# Patient Record
Sex: Male | Born: 1945 | Race: White | Hispanic: No | Marital: Married
Health system: Southern US, Community
[De-identification: ages and names within clinical notes are randomized; demographics above are authoritative.]

## PROBLEM LIST (undated history)

## (undated) DIAGNOSIS — N200 Calculus of kidney: Secondary | ICD-10-CM

## (undated) HISTORY — DX: Calculus of kidney: N20.0

---

## 2002-05-25 DIAGNOSIS — C8590 Non-Hodgkin lymphoma, unspecified, unspecified site: Secondary | ICD-10-CM

## 2002-05-25 HISTORY — DX: Non-Hodgkin lymphoma, unspecified, unspecified site: C85.90

## 2004-04-04 ENCOUNTER — Ambulatory Visit: Payer: Self-pay | Admitting: Oncology

## 2004-04-24 ENCOUNTER — Ambulatory Visit: Payer: Self-pay | Admitting: Oncology

## 2004-08-22 ENCOUNTER — Ambulatory Visit: Payer: Self-pay | Admitting: Family Medicine

## 2004-09-12 ENCOUNTER — Ambulatory Visit: Payer: Self-pay | Admitting: Oncology

## 2004-09-22 ENCOUNTER — Ambulatory Visit: Payer: Self-pay | Admitting: Oncology

## 2005-01-28 ENCOUNTER — Ambulatory Visit: Payer: Self-pay | Admitting: Oncology

## 2005-04-24 ENCOUNTER — Ambulatory Visit: Payer: Self-pay | Admitting: Family Medicine

## 2005-07-29 ENCOUNTER — Ambulatory Visit: Payer: Self-pay | Admitting: Oncology

## 2005-08-23 ENCOUNTER — Ambulatory Visit: Payer: Self-pay | Admitting: Oncology

## 2006-03-05 ENCOUNTER — Ambulatory Visit: Payer: Self-pay | Admitting: Oncology

## 2006-03-25 ENCOUNTER — Ambulatory Visit: Payer: Self-pay | Admitting: Oncology

## 2006-08-24 ENCOUNTER — Ambulatory Visit: Payer: Self-pay | Admitting: Oncology

## 2019-04-18 ENCOUNTER — Other Ambulatory Visit: Payer: Self-pay

## 2019-04-18 ENCOUNTER — Encounter: Payer: Self-pay | Admitting: Physical Therapy

## 2019-04-18 ENCOUNTER — Ambulatory Visit: Payer: No Typology Code available for payment source | Admitting: Physical Therapy

## 2019-04-18 ENCOUNTER — Ambulatory Visit: Payer: No Typology Code available for payment source | Attending: Neurosurgery | Admitting: Physical Therapy

## 2019-04-18 DIAGNOSIS — M545 Low back pain: Secondary | ICD-10-CM | POA: Insufficient documentation

## 2019-04-18 DIAGNOSIS — M6281 Muscle weakness (generalized): Secondary | ICD-10-CM | POA: Diagnosis present

## 2019-04-18 DIAGNOSIS — G8929 Other chronic pain: Secondary | ICD-10-CM | POA: Insufficient documentation

## 2019-04-18 DIAGNOSIS — R262 Difficulty in walking, not elsewhere classified: Secondary | ICD-10-CM | POA: Diagnosis present

## 2019-04-18 NOTE — Therapy (Signed)
[place], [place], [address] Phone: [phone]   Fax:  [phone]  Physical Therapy Evaluation  Patient Details  Name: Gary Lloyd MRN: [id number] Date of Birth: 1945-06-13 Referring Provider (PT): Consuella Lose, MD   Encounter Date: 04/18/2019  PT End of Session - 04/18/19 1015    Visit Number  1    Number of Visits  15    Date for PT Re-Evaluation  06/15/18    Authorization Type  [facility]    Authorization - Visit Number  1    Authorization - Number of Visits  15    PT Start Time  0930    PT Stop Time  [id number]    PT Time Calculation (min)  44 min    Activity Tolerance  Patient tolerated treatment well    Behavior During Therapy  [facility] for tasks assessed/performed       Past Medical History:  Diagnosis Date  . Kidney stones   . Non-Hodgkin's lymphoma ([place]) 2004   remission since 2004    History reviewed. No pertinent surgical history.  There were no vitals filed for this visit.   Subjective Assessment - 04/18/19 0942    Subjective  Back pain since about 2005. within the last 3-4 mo gets bad upticks that would last a week or so but lately it has held on for longer. Started with sciatica on Rt side, medial aspect of lower leg went numb. Lt nerve fires off to instep of foot. A lot of pain in quads when standing. Worse thing to do is wash dishes at sink. Has an inversion table. 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  Yes    Pain Score  3     Pain Location  Back    Pain Orientation  Right    Pain Descriptors / Indicators  Sore    Aggravating Factors   bending, lifting, washing dishes    Pain Relieving Factors  stretching         [facility] PT Assessment - 04/18/19 0001      Assessment   Medical Diagnosis  dorsalgia    Referring Provider (PT)   Consuella Lose, MD    Onset Date/Surgical Date  --   3-4 months   Hand Dominance  Right      Precautions   Precautions  None      Restrictions   Weight Bearing Restrictions  No      Balance Screen   Has the patient fallen in the past 6 months  No      [place] residence    Living Arrangements  Spouse/significant other;Children    Additional Comments  4 steps      Prior Function   Level of Independence  Independent    Vocation  Retired      Associate Professor   Overall Cognitive Status  Within Functional Limits for tasks assessed      Observation/Other Assessments   Focus on Therapeutic Outcomes (FOTO)   49% limited      Sensation   Additional Comments  WFL      Posture/Postural Control   Posture Comments  lacking curvature in thoracic and lumbar spine, bil scapular winging      ROM / Strength   AROM / PROM /  Strength  Strength      Strength   Strength Assessment Site  Hip    Right/Left Hip  Right;Left    Right Hip Flexion  4/5    Right Hip ABduction  4/5    Left Hip Flexion  4/5    Left Hip ABduction  4/5      Palpation   Palpation comment  TTP Lt paraspinals                Objective measurements completed on examination: See above findings.      [place] Adult PT Treatment/Exercise - 04/18/19 0001      Exercises   Exercises  Knee/Hip      Knee/Hip Exercises: Stretches   Piriformis Stretch Limitations  supine push & pull across      Manual Therapy   Manual therapy comments  tennis ball STM to piriformis             PT Education - 04/18/19 1655    Education Details  anatomy of condition, POC, HEp, exercise form/rationale    Person(s) Educated  Patient    Methods  Explanation;Demonstration;Tactile cues;Verbal cues;Handout    Comprehension  Returned demonstration;Verbal cues required;Verbalized understanding;Tactile cues required;Need further instruction       PT Short Term Goals - 04/18/19 1703       PT SHORT TERM GOAL #1   Title  centralization of pain to above gluteal fold    Baseline  to foot at eval    Time  4    Period  Weeks    Status  New    Target Date  05/19/19        PT Long Term Goals - 04/18/19 1700      PT LONG TERM GOAL #1   Title  Pt will demonstrate proper form in squat to lift in order to complete household chores    Baseline  significant pain at eval    Time  8    Period  Weeks    Status  New    Target Date  06/16/19      PT LONG TERM GOAL #2   Title  gross LE strength 5/5 for necessary support to lumbopelvic region    Baseline  see flowsheet    Time  8    Period  Weeks    Status  New    Target Date  06/16/19      PT LONG TERM GOAL #3   Title  Pt will be able to walk around home & yard with mild or less pain    Baseline  significant pain in quads that limits ability    Time  8    Period  Weeks    Status  New    Target Date  06/16/19      PT LONG TERM GOAL #4   Title  pt will be able to wash 1/2 of the dishes before requiring rest break    Baseline  unable at eval due to severe pain    Time  8    Period  Weeks    Status  New    Target Date  06/16/19             Plan - 04/18/19 1017    Clinical Impression Statement  Pt presents to PT with complaints of chronic back pain with subacute exacerbation. He is very active in performing yard work and household chores. tightness noted along post musculature with lack of curvature  resulting in poor mechanical movement. No s/s of radicular pain as result of impingement.  Pt will benefit from skilled PT to address deficits and reach functional goals.    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, mod Rt & mild Lt stenosis L2/3    Examination-Activity Limitations  Locomotion Level;Transfers;Reach Overhead;Bend;Bed Mobility;Sit;Carry;Sleep;Squat;Stairs;Stand;Lift    Examination-Participation  Restrictions  Meal Prep;Cleaning;Driving;Yard Work    [employer] officer  Evolving/Moderate complexity    Clinical Decision Making  Moderate    Rehab Potential  Good    PT Frequency  2x / week    PT Duration  8 weeks    PT Treatment/Interventions  ADLs/Self Care Home Management;Cryotherapy;Electrical Stimulation;Ultrasound;Traction;Moist Heat;Iontophoresis 4mg /ml Dexamethasone;Gait training;Stair training;Functional mobility training;Therapeutic activities;Therapeutic exercise;Balance training;Patient/family education;Neuromuscular re-education;Manual techniques;Passive range of motion;Dry needling;Taping    PT Next Visit Plan  STM or DN to paraspinals, core activation, begin stretching program at home    PT Home Exercise Plan  supine piriformis & HSS, tennis ball trigger point release    Consulted and Agree with Plan of Care  Patient       Patient will benefit from skilled therapeutic intervention in order to improve the following deficits and impairments:  Difficulty walking, Increased muscle spasms, Decreased activity tolerance, Pain, Improper body mechanics, Decreased strength, Postural dysfunction, Impaired sensation  Visit Diagnosis: Chronic right-sided low back pain, unspecified whether sciatica present - Plan: PT plan of care cert/re-cert  Difficulty in walking, not elsewhere classified - Plan: PT plan of care cert/re-cert  Muscle weakness (generalized) - Plan: PT plan of care cert/re-cert     Problem List There are no active problems to display for this patient.   Eileen Kangas C. Veron Senner PT, DPT 04/18/19 5:08 PM   [place] [facility] [address] [place], [place], [address] Phone: [phone]   Fax:  [phone]  Name: Gary Lloyd MRN: [id number] Date of Birth: 06/04/1945

## 2019-04-25 ENCOUNTER — Other Ambulatory Visit: Payer: Self-pay

## 2019-04-25 ENCOUNTER — Ambulatory Visit: Payer: No Typology Code available for payment source | Attending: Neurosurgery

## 2019-04-25 DIAGNOSIS — G8929 Other chronic pain: Secondary | ICD-10-CM | POA: Diagnosis present

## 2019-04-25 DIAGNOSIS — R262 Difficulty in walking, not elsewhere classified: Secondary | ICD-10-CM | POA: Diagnosis not present

## 2019-04-25 DIAGNOSIS — M6281 Muscle weakness (generalized): Secondary | ICD-10-CM | POA: Insufficient documentation

## 2019-04-25 DIAGNOSIS — M545 Low back pain: Secondary | ICD-10-CM | POA: Insufficient documentation

## 2019-04-25 NOTE — Therapy (Signed)
[place], [place], [address] Phone: [phone]   Fax:  [phone]  Physical Therapy Treatment  Patient Details  Name: Gary Lloyd MRN: [id number] Date of Birth: 1946/05/07 Referring Provider (PT): Consuella Lose, MD   Encounter Date: 04/25/2019  PT End of Session - 04/25/19 1006    Visit Number  2    Number of Visits  15    Date for PT Re-Evaluation  06/15/18    Authorization Type  [facility]    Authorization - Visit Number  2    Authorization - Number of Visits  15    PT Start Time  1005    PT Stop Time  1055    PT Time Calculation (min)  50 min    Activity Tolerance  Patient tolerated treatment well    Behavior During Therapy  [facility] for tasks assessed/performed       Past Medical History:  Diagnosis Date  . Kidney stones   . Non-Hodgkin's lymphoma ([place]) 2004   remission since 2004    History reviewed. No pertinent surgical history.  There were no vitals filed for this visit.  Subjective Assessment - 04/25/19 1007    Subjective  He reports no changes.   He reports LBP. He is doing the HEP. Tennis ball did not help but on softer surface    Pain Score  4     Pain Location  Back    Pain Orientation  Left;Lower    Pain Descriptors / Indicators  Dull;Aching                       [facility] Adult PT Treatment/Exercise - 04/25/19 0001      Lumbar Exercises: Supine   Pelvic Tilt  10 reps;10 seconds      Lumbar Exercises: Quadruped   [place]/Old Horse  10 reps    Other Quadruped Lumbar Exercises  childs pose with RT and LKT lateral stretch      Knee/Hip Exercises: Stretches   Active Hamstring Stretch  Right;Left;2 reps;30 seconds    Piriformis Stretch Limitations  supine push & pull across 30 sec x 2    Other Knee/Hip Stretches  bilateral knee to chest  2x20 sec       Modalities   Modalities  Electrical Stimulation;Moist Heat      Moist Heat Therapy   Number Minutes Moist  Heat  15 Minutes    Moist Heat Location  Lumbar Spine      Electrical Stimulation   Electrical Stimulation Location  lower back    Electrical Stimulation Action  pre mod    Electrical Stimulation Parameters  to tolerance    Electrical Stimulation Goals  Pain      Manual Therapy   Manual Therapy  Joint mobilization;Soft tissue mobilization;Manual Traction    Joint Mobilization  PA glides from lumbar to upper thoracic spine    Manual Traction  each leg x 100 osscilations             PT Education - 04/25/19 1045    Education Details  benefits of heat ans stim    Person(s) Educated  Patient    Methods  Explanation    Comprehension  Verbalized understanding       PT Short Term Goals - 04/18/19 1703      PT SHORT TERM GOAL #1   Title  centralization of pain to above gluteal fold    Baseline  to  foot at eval    Time  4    Period  Weeks    Status  New    Target Date  05/19/19        PT Long Term Goals - 04/18/19 1700      PT LONG TERM GOAL #1   Title  Pt will demonstrate proper form in squat to lift in order to complete household chores    Baseline  significant pain at eval    Time  8    Period  Weeks    Status  New    Target Date  06/16/19      PT LONG TERM GOAL #2   Title  gross LE strength 5/5 for necessary support to lumbopelvic region    Baseline  see flowsheet    Time  8    Period  Weeks    Status  New    Target Date  06/16/19      PT LONG TERM GOAL #3   Title  Pt will be able to walk around home & yard with mild or less pain    Baseline  significant pain in quads that limits ability    Time  8    Period  Weeks    Status  New    Target Date  06/16/19      PT LONG TERM GOAL #4   Title  pt will be able to wash 1/2 of the dishes before requiring rest break    Baseline  unable at eval due to severe pain    Time  8    Period  Weeks    Status  New    Target Date  06/16/19            Plan - 04/25/19 1046    Clinical Impression Statement  No  changes. Will add more core strength and cont modalities if helpful    PT Treatment/Interventions  ADLs/Self Care Home Management;Cryotherapy;Electrical Stimulation;Ultrasound;Traction;Moist Heat;Iontophoresis 4mg /ml Dexamethasone;Gait training;Stair training;Functional mobility training;Therapeutic activities;Therapeutic exercise;Balance training;Patient/family education;Neuromuscular re-education;Manual techniques;Passive range of motion;Dry needling;Taping    PT Next Visit Plan  STM or DN to paraspinals, core activation, begin stretching program at home, heat /stimm  if helpful    PT Home Exercise Plan  supine piriformis & HSS, tennis ball trigger point release    Consulted and Agree with Plan of Care  Patient       Patient will benefit from skilled therapeutic intervention in order to improve the following deficits and impairments:  Difficulty walking, Increased muscle spasms, Decreased activity tolerance, Pain, Improper body mechanics, Decreased strength, Postural dysfunction, Impaired sensation  Visit Diagnosis: Difficulty in walking, not elsewhere classified  Chronic right-sided low back pain, unspecified whether sciatica present  Muscle weakness (generalized)     Problem List There are no active problems to display for this patient.   Gary Lloyd  PT 04/25/2019, 10:47 AM  [facility] [address] [place], [place], [address] Phone: [phone]   Fax:  [phone]  Name: Gary Lloyd MRN: [id number] Date of Birth: 04/05/1946

## 2019-04-27 ENCOUNTER — Ambulatory Visit: Payer: No Typology Code available for payment source

## 2019-04-27 ENCOUNTER — Other Ambulatory Visit: Payer: Self-pay

## 2019-04-27 DIAGNOSIS — G8929 Other chronic pain: Secondary | ICD-10-CM

## 2019-04-27 DIAGNOSIS — M6281 Muscle weakness (generalized): Secondary | ICD-10-CM

## 2019-04-27 DIAGNOSIS — R262 Difficulty in walking, not elsewhere classified: Secondary | ICD-10-CM | POA: Diagnosis not present

## 2019-04-27 DIAGNOSIS — M545 Low back pain, unspecified: Secondary | ICD-10-CM

## 2019-04-27 NOTE — Therapy (Signed)
[place], [place], [address] Phone: [phone]   Fax:  [phone]  Physical Therapy Treatment  Patient Details  Name: Gary Lloyd MRN: [id number] Date of Birth: 07/14/1945 Referring Provider (PT): Consuella Lose, MD   Encounter Date: 04/27/2019  PT End of Session - 04/27/19 0912    Visit Number  3    Number of Visits  15    Date for PT Re-Evaluation  06/15/18    Authorization Type  [facility]    Authorization - Visit Number  3    Authorization - Number of Visits  15    PT Start Time  [phone]    PT Stop Time  0930    PT Time Calculation (min)  58 min    Activity Tolerance  Patient tolerated treatment well    Behavior During Therapy  [facility] for tasks assessed/performed       Past Medical History:  Diagnosis Date  . Kidney stones   . Non-Hodgkin's lymphoma ([place]) 2004   remission since 2004    No past surgical history on file.  There were no vitals filed for this visit.  Subjective Assessment - 04/27/19 0837    Subjective  Mild left side pain.  Heat and stim may have helped. Can't sleep on back with legs straight    Pain Score  2     Pain Location  Back    Pain Orientation  Left;Lower    Pain Descriptors / Indicators  Aching                       [facility] Adult PT Treatment/Exercise - 04/27/19 0001      Lumbar Exercises: Stretches   Single Knee to Chest Stretch  Left;2 reps;30 seconds    Piriformis Stretch  Left;1 rep;30 seconds    Piriformis Stretch Limitations  post manual    Piriformis Stretch Limitations  supine pull across 30 sec x 2      Lumbar Exercises: Supine   Pelvic Tilt  10 reps;10 seconds      Knee/Hip Exercises: Stretches   Other Knee/Hip Stretches  bilateral knee to chest  2x20 sec       Knee/Hip Exercises: Sidelying   Hip ABduction  Left;2 sets;10 reps    Clams  2x10 reps  LT       Knee/Hip Exercises: Prone   Other Prone Exercises  bilateral ball squeeze  with ankles knee flexed to 75 degrees prone      Moist Heat Therapy   Number Minutes Moist Heat  15 Minutes    Moist Heat Location  Lumbar Spine      Electrical Stimulation   Electrical Stimulation Location  lower back    Electrical Stimulation Action  IFC    Electrical Stimulation Parameters  to tolerance    Electrical Stimulation Goals  Pain      Manual Therapy   Manual Therapy  Passive ROM    Joint Mobilization  PA glides from lumbar to upper thoracic spine  deep breathing with mid thoracici sustained pressure and with LT sacrum 2 sets of 3.     Passive ROM  LT hip ER prone x 5 20 sec    Manual Traction  each leg x 100 osscilations               PT Short Term Goals - 04/18/19 1703      PT SHORT TERM GOAL #1   Title  centralization of pain to above gluteal fold    Baseline  to foot at eval    Time  4    Period  Weeks    Status  New    Target Date  05/19/19        PT Long Term Goals - 04/18/19 1700      PT LONG TERM GOAL #1   Title  Pt will demonstrate proper form in squat to lift in order to complete household chores    Baseline  significant pain at eval    Time  8    Period  Weeks    Status  New    Target Date  06/16/19      PT LONG TERM GOAL #2   Title  gross LE strength 5/5 for necessary support to lumbopelvic region    Baseline  see flowsheet    Time  8    Period  Weeks    Status  New    Target Date  06/16/19      PT LONG TERM GOAL #3   Title  Pt will be able to walk around home & yard with mild or less pain    Baseline  significant pain in quads that limits ability    Time  8    Period  Weeks    Status  New    Target Date  06/16/19      PT LONG TERM GOAL #4   Title  pt will be able to wash 1/2 of the dishes before requiring rest break    Baseline  unable at eval due to severe pain    Time  8    Period  Weeks    Status  New    Target Date  06/16/19            Plan - 04/27/19 0913    Clinical Impression Statement  Improved some  isnce last sesison but still LT SI area painStill tender in thoracic spine. Cont heat and stim as he felt this may have been helpful.    PT Treatment/Interventions  ADLs/Self Care Home Management;Cryotherapy;Electrical Stimulation;Ultrasound;Traction;Moist Heat;Iontophoresis 4mg /ml Dexamethasone;Gait training;Stair training;Functional mobility training;Therapeutic activities;Therapeutic exercise;Balance training;Patient/family education;Neuromuscular re-education;Manual techniques;Passive range of motion;Dry needling;Taping    PT Next Visit Plan  STM or DN to paraspinals, core activation, begin stretching program at home, heat /stimm  if helpful    PT Home Exercise Plan  supine piriformis & HSS, tennis ball trigger point release    Consulted and Agree with Plan of Care  Patient       Patient will benefit from skilled therapeutic intervention in order to improve the following deficits and impairments:  Difficulty walking, Increased muscle spasms, Decreased activity tolerance, Pain, Improper body mechanics, Decreased strength, Postural dysfunction, Impaired sensation  Visit Diagnosis: Chronic right-sided low back pain, unspecified whether sciatica present  Difficulty in walking, not elsewhere classified  Muscle weakness (generalized)  Chronic left-sided low back pain, unspecified whether sciatica present     Problem List There are no active problems to display for this patient.   [name]  PT 04/27/2019, 9:16 AM  [facility] [address] [place], [place], [address] Phone: [phone]   Fax:  [phone]  Name: Gary Lloyd MRN: [id number] Date of Birth: 08-11-1945

## 2019-05-02 ENCOUNTER — Ambulatory Visit: Payer: No Typology Code available for payment source | Admitting: Physical Therapy

## 2019-05-02 ENCOUNTER — Encounter: Payer: Self-pay | Admitting: Physical Therapy

## 2019-05-02 ENCOUNTER — Other Ambulatory Visit: Payer: Self-pay

## 2019-05-02 DIAGNOSIS — M545 Low back pain, unspecified: Secondary | ICD-10-CM

## 2019-05-02 DIAGNOSIS — G8929 Other chronic pain: Secondary | ICD-10-CM

## 2019-05-02 DIAGNOSIS — M6281 Muscle weakness (generalized): Secondary | ICD-10-CM

## 2019-05-02 DIAGNOSIS — R262 Difficulty in walking, not elsewhere classified: Secondary | ICD-10-CM

## 2019-05-02 NOTE — Therapy (Signed)
[place], [place], [address] Phone: [phone]   Fax:  [phone]  Physical Therapy Treatment  Patient Details  Name: Gary Lloyd MRN: [id number] Date of Birth: 09-05-1945 Referring Provider (PT): Consuella Lose, MD   Encounter Date: 05/02/2019  PT End of Session - 05/02/19 1104    Visit Number  4    Number of Visits  15    Date for PT Re-Evaluation  06/15/18    Authorization Type  [facility]    Authorization - Visit Number  4    Authorization - Number of Visits  15    PT Start Time  [id number]    PT Stop Time  1143    PT Time Calculation (min)  41 min    Activity Tolerance  Patient tolerated treatment well    Behavior During Therapy  [facility] for tasks assessed/performed       Past Medical History:  Diagnosis Date  . Kidney stones   . Non-Hodgkin's lymphoma ([place]) 2004   remission since 2004    History reviewed. No pertinent surgical history.  There were no vitals filed for this visit.  Subjective Assessment - 05/02/19 1106    Subjective  Resolved pain into foot after injection but I feel more pain in back. I think the exercise where I tighten my core has been the most helpful.    Patient Stated Goals  see subjective for chores/activities    Currently in Pain?  Yes    Pain Score  3     Pain Location  Back    Pain Orientation  Left;Lower    Pain Descriptors / Indicators  Tightness                       [facility] Adult PT Treatment/Exercise - 05/02/19 0001      Lumbar Exercises: Stretches   Hip Flexor Stretch Limitations  modified thomas position    Gastroc Stretch Limitations  standing lunge position      Lumbar Exercises: Supine   Bridge with [employer]  10 reps    Bridge with [employer] Limitations  feet flexed, cues for core activation    Other Supine Lumbar Exercises  TT toe taps      Knee/Hip Exercises: Stretches   Passive Hamstring Stretch Limitations  seated in chair     Piriformis Stretch Limitations  supine push & pull      Knee/Hip Exercises: Aerobic   Nustep  5 min L6 UE & Le      Manual Therapy   Manual Therapy  Soft tissue mobilization    Manual therapy comments  skilled palpation and monitoring during TPDN    Soft tissue mobilization  Lt hip- piriformis, glut max/med/TFL       Trigger Point Dry Needling - 05/02/19 0001    Consent Given?  Yes    Education Handout Provided  --   verbal education   Muscles Treated Back/Hip  Gluteus medius;Gluteus maximus;Tensor fascia lata;Piriformis    Other Dry Needling  all on Left side    Gluteus Medius Response  Twitch response elicited;Palpable increased muscle length    Gluteus Maximus Response  Twitch response elicited;Palpable increased muscle length    Piriformis Response  Twitch response elicited;Palpable increased muscle length    Tensor Fascia Lata Response  Twitch response elicited;Palpable increased muscle length           PT Education - 05/02/19 1144  Education Details  recognizing core activation, TPDN & expected outcomes    Person(s) Educated  Patient    Methods  Explanation;Demonstration;Tactile cues;Verbal cues;Handout    Comprehension  Verbalized understanding;Returned demonstration;Verbal cues required;Tactile cues required;Need further instruction       PT Short Term Goals - 05/02/19 1252      PT SHORT TERM GOAL #1   Title  centralization of pain to above gluteal fold    Status  Achieved        PT Long Term Goals - 04/18/19 1700      PT LONG TERM GOAL #1   Title  Pt will demonstrate proper form in squat to lift in order to complete household chores    Baseline  significant pain at eval    Time  8    Period  Weeks    Status  New    Target Date  06/16/19      PT LONG TERM GOAL #2   Title  gross LE strength 5/5 for necessary support to lumbopelvic region    Baseline  see flowsheet    Time  8    Period  Weeks    Status  New    Target Date  06/16/19      PT  LONG TERM GOAL #3   Title  Pt will be able to walk around home & yard with mild or less pain    Baseline  significant pain in quads that limits ability    Time  8    Period  Weeks    Status  New    Target Date  06/16/19      PT LONG TERM GOAL #4   Title  pt will be able to wash 1/2 of the dishes before requiring rest break    Baseline  unable at eval due to severe pain    Time  8    Period  Weeks    Status  New    Target Date  06/16/19            Plan - 05/02/19 1250    Clinical Impression Statement  decr concordant pain and tightness following TPDN today. requires further progression of core strength. Limited hip hinge due to hamstring flexibility.    PT Treatment/Interventions  ADLs/Self Care Home Management;Cryotherapy;Electrical Stimulation;Ultrasound;Traction;Moist Heat;Iontophoresis 4mg /ml Dexamethasone;Gait training;Stair training;Functional mobility training;Therapeutic activities;Therapeutic exercise;Balance training;Patient/family education;Neuromuscular re-education;Manual techniques;Passive range of motion;Dry needling;Taping    PT Next Visit Plan  hip hinge with dowel, progress core, how did stretches go at home?    PT Home Exercise Plan  supine piriformis & HSS, tennis ball trigger point release, seated HSS, supine hip flexor stretch, gastroc stretch, supine TT taps, bridge with ball squeeze    Consulted and Agree with Plan of Care  Patient       Patient will benefit from skilled therapeutic intervention in order to improve the following deficits and impairments:  Difficulty walking, Increased muscle spasms, Decreased activity tolerance, Pain, Improper body mechanics, Decreased strength, Postural dysfunction, Impaired sensation  Visit Diagnosis: Chronic right-sided low back pain, unspecified whether sciatica present  Difficulty in walking, not elsewhere classified  Muscle weakness (generalized)  Chronic left-sided low back pain, unspecified whether sciatica  present     Problem List There are no active problems to display for this patient.  Chidera Thivierge C. Yasin Ducat PT, DPT 05/02/19 12:53 PM   [facility] [facility] [address] [place], [place], [address] Phone: [phone]   Fax:  [phone]  Name: Gary Lloyd MRN: [id number] Date of Birth: 09-Sep-1945

## 2019-05-04 ENCOUNTER — Encounter: Payer: No Typology Code available for payment source | Admitting: Physical Therapy

## 2019-05-09 ENCOUNTER — Ambulatory Visit: Payer: No Typology Code available for payment source | Admitting: Physical Therapy

## 2019-05-09 ENCOUNTER — Other Ambulatory Visit: Payer: Self-pay

## 2019-05-09 ENCOUNTER — Encounter: Payer: Self-pay | Admitting: Physical Therapy

## 2019-05-09 DIAGNOSIS — G8929 Other chronic pain: Secondary | ICD-10-CM

## 2019-05-09 DIAGNOSIS — M545 Low back pain, unspecified: Secondary | ICD-10-CM

## 2019-05-09 DIAGNOSIS — M6281 Muscle weakness (generalized): Secondary | ICD-10-CM

## 2019-05-09 DIAGNOSIS — R262 Difficulty in walking, not elsewhere classified: Secondary | ICD-10-CM | POA: Diagnosis not present

## 2019-05-09 NOTE — Therapy (Signed)
[place], [place], [address] Phone: [phone]   Fax:  [phone]  Physical Therapy Treatment  Patient Details  Name: Gary Lloyd MRN: [id number] Date of Birth: 01/14/1946 Referring Provider (PT): Consuella Lose, MD   Encounter Date: 05/09/2019  PT End of Session - 05/09/19 0939    Visit Number  5    Number of Visits  15    Date for PT Re-Evaluation  06/15/18    Authorization Type  [facility]    Authorization - Visit Number  5    Authorization - Number of Visits  15    PT Start Time  [phone]    PT Stop Time  1014    PT Time Calculation (min)  38 min    Activity Tolerance  Patient tolerated treatment well    Behavior During Therapy  [facility] for tasks assessed/performed       Past Medical History:  Diagnosis Date  . Kidney stones   . Non-Hodgkin's lymphoma ([place]) 2004   remission since 2004    History reviewed. No pertinent surgical history.  There were no vitals filed for this visit.  Subjective Assessment - 05/09/19 0936    Subjective  Noticing weakness in Rt leg, i.e going up stairs was difficult    Patient Stated Goals  see subjective for chores/activities    Currently in Pain?  No/denies         [facility] PT Assessment - 05/09/19 0001      High Level Balance   High Level Balance Comments  bil 30s holds-standing on Rt leg resulting in transfer of weight laterally and Lt hip drop                   [facility] Adult PT Treatment/Exercise - 05/09/19 0001      Lumbar Exercises: Supine   Bridge with Ball Squeeze Limitations  single leg with ball bw knees    Other Supine Lumbar Exercises  proper crunch form    Other Supine Lumbar Exercises  TT taps with upper body lift hold      Knee/Hip Exercises: Stretches   Passive Hamstring Stretch Limitations  seated in chair    Piriformis Stretch Limitations  seated in chair      Knee/Hip Exercises: Aerobic   Nustep  5 min L8 UE & Le      Knee/Hip Exercises: Standing   Side Lunges  Both;15 reps    Other Standing Knee Exercises  Lt hip hike-Rt foot on 2" step    Other Standing Knee Exercises  hip hinge             PT Education - 05/09/19 1641    Education Details  progressing exercises, long-term daily stretches    Person(s) Educated  Patient    Methods  Explanation    Comprehension  Verbalized understanding;Need further instruction       PT Short Term Goals - 05/02/19 1252      PT SHORT TERM GOAL #1   Title  centralization of pain to above gluteal fold    Status  Achieved        PT Long Term Goals - 04/18/19 1700      PT LONG TERM GOAL #1   Title  Pt will demonstrate proper form in squat to lift in order to complete household chores    Baseline  significant pain at eval    Time  8    Period  Weeks  Status  New    Target Date  06/16/19      PT LONG TERM GOAL #2   Title  gross LE strength 5/5 for necessary support to lumbopelvic region    Baseline  see flowsheet    Time  8    Period  Weeks    Status  New    Target Date  06/16/19      PT LONG TERM GOAL #3   Title  Pt will be able to walk around home & yard with mild or less pain    Baseline  significant pain in quads that limits ability    Time  8    Period  Weeks    Status  New    Target Date  06/16/19      PT LONG TERM GOAL #4   Title  pt will be able to wash 1/2 of the dishes before requiring rest break    Baseline  unable at eval due to severe pain    Time  8    Period  Weeks    Status  New    Target Date  06/16/19            Plan - 05/09/19 1641    Clinical Impression Statement  Poor activation of Rt hip abductors in CKC resulting in drop of Left hip. Able to activate with cuing during exercises. Hip hinge improved tolerance to bending. He will try this at home and I suggested setting a 10 min timer if he is going to be bending over cooking for cleaning for a while as that amount of time will faitgue the lower back.    PT  Treatment/Interventions  ADLs/Self Care Home Management;Cryotherapy;Electrical Stimulation;Ultrasound;Traction;Moist Heat;Iontophoresis 4mg /ml Dexamethasone;Gait training;Stair training;Functional mobility training;Therapeutic activities;Therapeutic exercise;Balance training;Patient/family education;Neuromuscular re-education;Manual techniques;Passive range of motion;Dry needling;Taping    PT Home Exercise Plan  supine piriformis & HSS, tennis ball trigger point release, seated HSS, supine hip flexor stretch, gastroc stretch, supine TT taps, single leg bridge with ball squeeze, side lunge, hip hike on step    Consulted and Agree with Plan of Care  Patient       Patient will benefit from skilled therapeutic intervention in order to improve the following deficits and impairments:  Difficulty walking, Increased muscle spasms, Decreased activity tolerance, Pain, Improper body mechanics, Decreased strength, Postural dysfunction, Impaired sensation  Visit Diagnosis: Chronic right-sided low back pain, unspecified whether sciatica present  Difficulty in walking, not elsewhere classified  Muscle weakness (generalized)  Chronic left-sided low back pain, unspecified whether sciatica present     Problem List There are no problems to display for this patient.   Rozlynn Lippold C. Gillermo Poch PT, DPT 05/09/19 4:44 PM   [facility] [address] [place], [place], [address] Phone: [phone]   Fax:  [phone]  Name: ELEFTHERIOS BLYSTONE MRN: [id number] Date of Birth: 03/10/1946

## 2019-05-11 ENCOUNTER — Other Ambulatory Visit: Payer: Self-pay

## 2019-05-11 ENCOUNTER — Encounter: Payer: Self-pay | Admitting: Physical Therapy

## 2019-05-11 ENCOUNTER — Ambulatory Visit: Payer: No Typology Code available for payment source | Admitting: Physical Therapy

## 2019-05-11 DIAGNOSIS — M6281 Muscle weakness (generalized): Secondary | ICD-10-CM

## 2019-05-11 DIAGNOSIS — G8929 Other chronic pain: Secondary | ICD-10-CM

## 2019-05-11 DIAGNOSIS — R262 Difficulty in walking, not elsewhere classified: Secondary | ICD-10-CM

## 2019-05-11 DIAGNOSIS — M545 Low back pain, unspecified: Secondary | ICD-10-CM

## 2019-05-11 NOTE — Therapy (Signed)
[place], [place], [address] Phone: [phone]   Fax:  [phone]  Physical Therapy Treatment  Patient Details  Name: Gary Lloyd MRN: [id number] Date of Birth: 22-Sep-1945 Referring Provider (PT): Consuella Lose, MD   Encounter Date: 05/11/2019  PT End of Session - 05/11/19 1010    Visit Number  6    Number of Visits  15    Date for PT Re-Evaluation  06/15/18    Authorization Type  [facility]    Authorization - Visit Number  6    Authorization - Number of Visits  15    PT Start Time  [phone]    PT Stop Time  1010    PT Time Calculation (min)  39 min    Activity Tolerance  Patient tolerated treatment well    Behavior During Therapy  [facility] for tasks assessed/performed       Past Medical History:  Diagnosis Date  . Kidney stones   . Non-Hodgkin's lymphoma ([place]) 2004   remission since 2004    History reviewed. No pertinent surgical history.  There were no vitals filed for this visit.  Subjective Assessment - 05/11/19 0933    Subjective  Noticed weakness is a little better but hip hike is painful on post hip & SIJ    Currently in Pain?  No/denies                       [facility] Adult PT Treatment/Exercise - 05/11/19 0001      Lumbar Exercises: Stretches   Other Lumbar Stretch Exercise  pull from sink      Knee/Hip Exercises: Stretches   Passive Hamstring Stretch Limitations  supine on table by PT    Piriformis Stretch Limitations  supine pull across & figure 4      Knee/Hip Exercises: Standing   SLS  in mini squat & upright      Knee/Hip Exercises: Sidelying   Clams  clams with yellow tband    Other Sidelying Knee/Hip Exercises  90/90 lift yellow tband with press back    Other Sidelying Knee/Hip Exercises  90/90 lift alt heel-toe taps      Manual Therapy   Joint Mobilization  lumbar PA gr 4, bil SIJ PA Gr 4    Soft tissue mobilization  Rt superior glut max, piriformis                PT Short Term Goals - 05/02/19 1252      PT SHORT TERM GOAL #1   Title  centralization of pain to above gluteal fold    Status  Achieved        PT Long Term Goals - 04/18/19 1700      PT LONG TERM GOAL #1   Title  Pt will demonstrate proper form in squat to lift in order to complete household chores    Baseline  significant pain at eval    Time  8    Period  Weeks    Status  New    Target Date  06/16/19      PT LONG TERM GOAL #2   Title  gross LE strength 5/5 for necessary support to lumbopelvic region    Baseline  see flowsheet    Time  8    Period  Weeks    Status  New    Target Date  06/16/19      PT LONG TERM GOAL #  3   Title  Pt will be able to walk around home & yard with mild or less pain    Baseline  significant pain in quads that limits ability    Time  8    Period  Weeks    Status  New    Target Date  06/16/19      PT LONG TERM GOAL #4   Title  pt will be able to wash 1/2 of the dishes before requiring rest break    Baseline  unable at eval due to severe pain    Time  8    Period  Weeks    Status  New    Target Date  06/16/19            Plan - 05/11/19 1010    Clinical Impression Statement  Tightness in musculature surrounding SIJ and hip was causing pain and was reduced with STM. Limited mobility in lumbar spine addressed with joint mobilizations as well. Improved posture in SLS with continued hip drop but has improved overall. Instabiility in bil ankles contributing to difficulty utilizing proximal control.    PT Treatment/Interventions  ADLs/Self Care Home Management;Cryotherapy;Electrical Stimulation;Ultrasound;Traction;Moist Heat;Iontophoresis 4mg /ml Dexamethasone;Gait training;Stair training;Functional mobility training;Therapeutic activities;Therapeutic exercise;Balance training;Patient/family education;Neuromuscular re-education;Manual techniques;Passive range of motion;Dry needling;Taping    PT Next Visit Plan  wobble board  & airex for ankle stability & Rt/Lt activation    PT Home Exercise Plan  supine piriformis & HSS, tennis ball trigger point release, seated HSS, supine hip flexor stretch, gastroc stretch, supine TT taps, single leg bridge with ball squeeze, side lunge, hip hike on step; clam, 90/90 abd+ext, 90/90 knee/ankle taps    Consulted and Agree with Plan of Care  Patient       Patient will benefit from skilled therapeutic intervention in order to improve the following deficits and impairments:  Difficulty walking, Increased muscle spasms, Decreased activity tolerance, Pain, Improper body mechanics, Decreased strength, Postural dysfunction, Impaired sensation  Visit Diagnosis: Chronic right-sided low back pain, unspecified whether sciatica present  Difficulty in walking, not elsewhere classified  Muscle weakness (generalized)  Chronic left-sided low back pain, unspecified whether sciatica present     Problem List There are no problems to display for this patient.   Lundy Cozart C. Oliana Gowens PT, DPT 05/11/19 10:13 AM   [facility] [address] [place], [place], [address] Phone: [phone]   Fax:  [phone]  Name: SEVIN PACHA MRN: [id number] Date of Birth: March 31, 1946

## 2019-05-15 ENCOUNTER — Ambulatory Visit: Payer: No Typology Code available for payment source | Admitting: Physical Therapy

## 2019-05-15 ENCOUNTER — Encounter: Payer: Self-pay | Admitting: Physical Therapy

## 2019-05-15 ENCOUNTER — Other Ambulatory Visit: Payer: Self-pay

## 2019-05-15 DIAGNOSIS — R262 Difficulty in walking, not elsewhere classified: Secondary | ICD-10-CM | POA: Diagnosis not present

## 2019-05-15 DIAGNOSIS — M545 Low back pain, unspecified: Secondary | ICD-10-CM

## 2019-05-15 DIAGNOSIS — G8929 Other chronic pain: Secondary | ICD-10-CM

## 2019-05-15 DIAGNOSIS — M6281 Muscle weakness (generalized): Secondary | ICD-10-CM

## 2019-05-15 NOTE — Therapy (Signed)
[place], [place], [address] Phone: [phone]   Fax:  [phone]  Physical Therapy Treatment  Patient Details  Name: Gary Lloyd MRN: [id number] Date of Birth: 08/01/1945 Referring Provider (PT): Consuella Lose, MD   Encounter Date: 05/15/2019  PT End of Session - 05/15/19 0933    Visit Number  7    Number of Visits  15    Date for PT Re-Evaluation  06/15/18    Authorization Type  [facility]    Authorization - Visit Number  7    Authorization - Number of Visits  15    PT Start Time  0933    PT Stop Time  [id number]    PT Time Calculation (min)  41 min    Activity Tolerance  Patient tolerated treatment well    Behavior During Therapy  [facility] for tasks assessed/performed       Past Medical History:  Diagnosis Date  . Kidney stones   . Non-Hodgkin's lymphoma ([place]) 2004   remission since 2004    History reviewed. No pertinent surgical history.  There were no vitals filed for this visit.  Subjective Assessment - 05/15/19 0933    Subjective  Cut down a large tree over the weekend. I am sore. Have about 6 exercises that I do without fail.    Patient Stated Goals  see subjective for chores/activities    Currently in Pain?  Yes    Pain Location  Back    Pain Orientation  Lower    Pain Descriptors / Indicators  Sore         [facility] PT Assessment - 05/15/19 0001      Strength   Right Hip Flexion  4+/5    Right Hip ABduction  4+/5    Left Hip Flexion  5/5    Left Hip ABduction  4+/5      Palpation   Palpation comment  trigger point in Lt superior fibers of glut max                   [facility] Adult PT Treatment/Exercise - 05/15/19 0001      Lumbar Exercises: Stretches   Quadruped Mid Back Stretch Limitations  cat camel    Other Lumbar Stretch Exercise  child pose center & laterals      Knee/Hip Exercises: Stretches   Gastroc Stretch Limitations  standing lunge      Knee/Hip  Exercises: Aerobic   Stationary Bike  5 min L3      Knee/Hip Exercises: Standing   Rocker Board Limitations  lateral & A/P, static & dynamic      Manual Therapy   Soft tissue mobilization  Lt superior glut max TPR             PT Education - 05/15/19 1020    Education Details  progress & rationale for exercise    Person(s) Educated  Patient    Methods  Explanation    Comprehension  Verbalized understanding;Need further instruction       PT Short Term Goals - 05/02/19 1252      PT SHORT TERM GOAL #1   Title  centralization of pain to above gluteal fold    Status  Achieved        PT Long Term Goals - 04/18/19 1700      PT LONG TERM GOAL #1   Title  Pt will demonstrate proper form in squat to lift in order  to complete household chores    Baseline  significant pain at eval    Time  8    Period  Weeks    Status  New    Target Date  06/16/19      PT LONG TERM GOAL #2   Title  gross LE strength 5/5 for necessary support to lumbopelvic region    Baseline  see flowsheet    Time  8    Period  Weeks    Status  New    Target Date  06/16/19      PT LONG TERM GOAL #3   Title  Pt will be able to walk around home & yard with mild or less pain    Baseline  significant pain in quads that limits ability    Time  8    Period  Weeks    Status  New    Target Date  06/16/19      PT LONG TERM GOAL #4   Title  pt will be able to wash 1/2 of the dishes before requiring rest break    Baseline  unable at eval due to severe pain    Time  8    Period  Weeks    Status  New    Target Date  06/16/19            Plan - 05/15/19 1017    Clinical Impression Statement  Trigger points released that were developed after using a chain saw to cut down a large tree. pt demo improvements in gross strength as well as knowledge of use of posture. Is using his exercises regularly. Would benefit from further core strengthening for lumbopelvic stability. Unstable on wobble board today  indicating poor Rt/Lt and A/P activation.    PT Treatment/Interventions  ADLs/Self Care Home Management;Cryotherapy;Electrical Stimulation;Ultrasound;Traction;Moist Heat;Iontophoresis 4mg /ml Dexamethasone;Gait training;Stair training;Functional mobility training;Therapeutic activities;Therapeutic exercise;Balance training;Patient/family education;Neuromuscular re-education;Manual techniques;Passive range of motion;Dry needling;Taping    PT Next Visit Plan  wobble board & airex for ankle stability & Rt/Lt activation    PT Home Exercise Plan  supine piriformis & HSS, tennis ball trigger point release, seated HSS, supine hip flexor stretch, gastroc stretch, supine TT taps, single leg bridge with ball squeeze, side lunge, hip hike on step; clam, 90/90 abd+ext, 90/90 knee/ankle taps    Consulted and Agree with Plan of Care  Patient       Patient will benefit from skilled therapeutic intervention in order to improve the following deficits and impairments:  Difficulty walking, Increased muscle spasms, Decreased activity tolerance, Pain, Improper body mechanics, Decreased strength, Postural dysfunction, Impaired sensation  Visit Diagnosis: Chronic right-sided low back pain, unspecified whether sciatica present  Difficulty in walking, not elsewhere classified  Muscle weakness (generalized)  Chronic left-sided low back pain, unspecified whether sciatica present     Problem List There are no problems to display for this patient.  [name] PT, DPT 05/15/19 10:21 AM   [place] [facility] [address] [place], [place], [address] Phone: [phone]   Fax:  [phone]  Name: Gary Lloyd MRN: [id number] Date of Birth: 17-Sep-1945

## 2019-05-17 ENCOUNTER — Ambulatory Visit: Payer: No Typology Code available for payment source | Admitting: Physical Therapy

## 2019-05-30 ENCOUNTER — Encounter: Payer: Self-pay | Admitting: Physical Therapy

## 2019-05-30 ENCOUNTER — Other Ambulatory Visit: Payer: Self-pay

## 2019-05-30 ENCOUNTER — Ambulatory Visit: Payer: No Typology Code available for payment source | Attending: Neurosurgery | Admitting: Physical Therapy

## 2019-05-30 DIAGNOSIS — M545 Low back pain, unspecified: Secondary | ICD-10-CM

## 2019-05-30 DIAGNOSIS — G8929 Other chronic pain: Secondary | ICD-10-CM

## 2019-05-30 DIAGNOSIS — R262 Difficulty in walking, not elsewhere classified: Secondary | ICD-10-CM | POA: Insufficient documentation

## 2019-05-30 DIAGNOSIS — M6281 Muscle weakness (generalized): Secondary | ICD-10-CM | POA: Insufficient documentation

## 2019-05-30 NOTE — Therapy (Addendum)
[place], [place], [address] Phone: [phone]   Fax:  [phone]  Physical Therapy Treatment/Discharge  Patient Details  Name: Gary Lloyd MRN: [id number] Date of Birth: 08-20-1945 Referring Provider (PT): Consuella Lose, MD   Encounter Date: 05/30/2019  PT End of Session - 05/30/19 0936    Visit Number  8    Number of Visits  15    Date for PT Re-Evaluation  06/15/18    Authorization Type  [facility]    Authorization - Visit Number  8    Authorization - Number of Visits  15    PT Start Time  0933    PT Stop Time  0623    PT Time Calculation (min)  41 min    Activity Tolerance  Patient tolerated treatment well    Behavior During Therapy  [facility] for tasks assessed/performed       Past Medical History:  Diagnosis Date  . Kidney stones   . Non-Hodgkin's lymphoma ([place]) 2004   remission since 2004    History reviewed. No pertinent surgical history.  There were no vitals filed for this visit.  Subjective Assessment - 05/30/19 0935    Subjective  Had a really bad day yesterday but overall feeling pretty good. A little twinge in the Left low back today    Patient Stated Goals  see subjective for chores/activities    Currently in Pain?  Yes    Pain Score  --   minimal   Pain Location  Back    Pain Orientation  Left;Lower    Pain Descriptors / Indicators  --   twinge   Aggravating Factors   bending, lifting, washing dishes    Pain Relieving Factors  stretching                       [facility] Adult PT Treatment/Exercise - 05/30/19 0001      Lumbar Exercises: Stretches   Lower Trunk Rotation  5 reps      Lumbar Exercises: Supine   AB Set Limitations  ab set with ball squeeze    Bridge with [employer]  15 reps    Bridge with [employer] Limitations  tapping table    Other Supine Lumbar Exercises  TT lift with ball bw knees      Lumbar Exercises: Quadruped   Single Arm Raise   Left;Right;10 reps    Straight Leg Raise  10 reps      Knee/Hip Exercises: Standing   Rocker Board Limitations  lateral static- creating dishes/cooking posture      Manual Therapy   Joint Mobilization  thoracic PA, lumbar PA; post rotation of Rt innominate    Soft tissue mobilization  LT superior glut fibers, Lt QL, Rt mid-thoracic paraspinals               PT Short Term Goals - 05/02/19 1252      PT SHORT TERM GOAL #1   Title  centralization of pain to above gluteal fold    Status  Achieved        PT Long Term Goals - 04/18/19 1700      PT LONG TERM GOAL #1   Title  Pt will demonstrate proper form in squat to lift in order to complete household chores    Baseline  significant pain at eval    Time  8    Period  Weeks    Status  New    Target Date  06/16/19      PT LONG TERM GOAL #2   Title  gross LE strength 5/5 for necessary support to lumbopelvic region    Baseline  see flowsheet    Time  8    Period  Weeks    Status  New    Target Date  06/16/19      PT LONG TERM GOAL #3   Title  Pt will be able to walk around home & yard with mild or less pain    Baseline  significant pain in quads that limits ability    Time  8    Period  Weeks    Status  New    Target Date  06/16/19      PT LONG TERM GOAL #4   Title  pt will be able to wash 1/2 of the dishes before requiring rest break    Baseline  unable at eval due to severe pain    Time  8    Period  Weeks    Status  New    Target Date  06/16/19            Plan - 05/30/19 1015    Clinical Impression Statement  trigger points noted in Lt gluts/QL and were reduced with manual therapy with soreness following as expected. still very unstable Rt to Lt on wobble board inidcating poor midline stability through hips/core. added qped to HEP for diagonal activation and stabilization through core.    PT Treatment/Interventions  ADLs/Self Care Home Management;Cryotherapy;Electrical  Stimulation;Ultrasound;Traction;Moist Heat;Iontophoresis 11m/ml Dexamethasone;Gait training;Stair training;Functional mobility training;Therapeutic activities;Therapeutic exercise;Balance training;Patient/family education;Neuromuscular re-education;Manual techniques;Passive range of motion;Dry needling;Taping    PT Next Visit Plan  review HEP & adjust PRN    PT Home Exercise Plan  supine piriformis & HSS, tennis ball trigger point release, seated HSS, supine hip flexor stretch, gastroc stretch, supine TT taps, single leg bridge with ball squeeze, side lunge, hip hike on step; clam, 90/90 abd+ext, 90/90 knee/ankle taps; bil TT lift with ball, qped UE & LE ext, double leg brdge    Consulted and Agree with Plan of Care  Patient       Patient will benefit from skilled therapeutic intervention in order to improve the following deficits and impairments:  Difficulty walking, Increased muscle spasms, Decreased activity tolerance, Pain, Improper body mechanics, Decreased strength, Postural dysfunction, Impaired sensation  Visit Diagnosis: Chronic right-sided low back pain, unspecified whether sciatica present  Difficulty in walking, not elsewhere classified  Muscle weakness (generalized)  Chronic left-sided low back pain, unspecified whether sciatica present     Problem List There are no problems to display for this patient.   Delwyn Scoggin C. Benjamin Merrihew PT, DPT 05/30/19 10:17 AM   [facility] [place] [address]Phone: [phone]  Fax:  [phone] Name: Gary Lloyd: [id number] of Birth: 1Mar 25, 1947 PHYSICAL THERAPY DISCHARGE SUMMARY  Visits from Start of Care: 8  Current functional level related to goals / functional outcomes: See above   Remaining deficits: See above   Education / Equipment: Anatomy of condition, POC, HEP, exercise form/rationale  Plan: Patient agrees to discharge.  Patient  goals were partially met. Patient is being discharged due to not returning since the last visit.  ?????     Legend Pecore C. Gary Lloyd PT, DPT 07/04/19 9:50 AM

## 2019-06-06 ENCOUNTER — Ambulatory Visit: Payer: No Typology Code available for payment source | Admitting: Physical Therapy

## 2019-07-27 ENCOUNTER — Emergency Department (HOSPITAL_COMMUNITY): Payer: No Typology Code available for payment source

## 2019-07-27 ENCOUNTER — Encounter (HOSPITAL_COMMUNITY): Payer: Self-pay

## 2019-07-27 ENCOUNTER — Emergency Department (HOSPITAL_COMMUNITY)
Admission: EM | Admit: 2019-07-27 | Discharge: 2019-07-27 | Disposition: A | Discharge status: Home / Self Care | Payer: No Typology Code available for payment source | Attending: Emergency Medicine | Admitting: Emergency Medicine

## 2019-07-27 ENCOUNTER — Other Ambulatory Visit: Payer: Self-pay

## 2019-07-27 DIAGNOSIS — R42 Dizziness and giddiness: Secondary | ICD-10-CM | POA: Insufficient documentation

## 2019-07-27 DIAGNOSIS — Z8572 Personal history of non-Hodgkin lymphomas: Secondary | ICD-10-CM | POA: Diagnosis not present

## 2019-07-27 DIAGNOSIS — Z87442 Personal history of urinary calculi: Secondary | ICD-10-CM | POA: Diagnosis not present

## 2019-07-27 DIAGNOSIS — Z79899 Other long term (current) drug therapy: Secondary | ICD-10-CM | POA: Diagnosis not present

## 2019-07-27 DIAGNOSIS — R479 Unspecified speech disturbances: Secondary | ICD-10-CM | POA: Insufficient documentation

## 2019-07-27 LAB — COMPREHENSIVE METABOLIC PANEL
ALT: 19 U/L (ref 0–44)
AST: 26 U/L (ref 15–41)
Albumin: 4.5 g/dL (ref 3.5–5.0)
Alkaline Phosphatase: 46 U/L (ref 38–126)
Anion gap: 14 (ref 5–15)
BUN: 21 mg/dL (ref 8–23)
CO2: 22 mmol/L (ref 22–32)
Calcium: 9.6 mg/dL (ref 8.9–10.3)
Chloride: 102 mmol/L (ref 98–111)
Creatinine, Ser: 0.9 mg/dL (ref 0.61–1.24)
GFR calc Af Amer: >60 mL/min (ref >60)
GFR calc non Af Amer: >60 mL/min (ref >60)
Glucose, Bld: 109 mg/dL — ABNORMAL HIGH (ref 70–99)
Potassium: 3.6 mmol/L (ref 3.5–5.1)
Sodium: 138 mmol/L (ref 135–145)
Total Bilirubin: 0.6 mg/dL (ref 0.3–1.2)
Total Protein: 7.3 g/dL (ref 6.5–8.1)

## 2019-07-27 LAB — DIFFERENTIAL
Abs Immature Granulocytes: 0.02 10*3/uL (ref 0.00–0.07)
Basophils Absolute: 0 10*3/uL (ref 0.0–0.1)
Basophils Relative: 0 %
Eosinophils Absolute: 0 10*3/uL (ref 0.0–0.5)
Eosinophils Relative: 0 %
Immature Granulocytes: 0 %
Lymphocytes Relative: 25 %
Lymphs Abs: 2.4 10*3/uL (ref 0.7–4.0)
Monocytes Absolute: 0.6 10*3/uL (ref 0.1–1.0)
Monocytes Relative: 6 %
Neutro Abs: 6.5 10*3/uL (ref 1.7–7.7)
Neutrophils Relative %: 69 %

## 2019-07-27 LAB — I-STAT CHEM 8, ED
BUN: 24 mg/dL — ABNORMAL HIGH (ref 8–23)
Calcium, Ion: 1.17 mmol/L (ref 1.15–1.40)
Chloride: 103 mmol/L (ref 98–111)
Creatinine, Ser: 0.8 mg/dL (ref 0.61–1.24)
Glucose, Bld: 108 mg/dL — ABNORMAL HIGH (ref 70–99)
HCT: 45 % (ref 39.0–52.0)
Hemoglobin: 15.3 g/dL (ref 13.0–17.0)
Potassium: 3.5 mmol/L (ref 3.5–5.1)
Sodium: 138 mmol/L (ref 135–145)
TCO2: 29 mmol/L (ref 22–32)

## 2019-07-27 LAB — CBC
HCT: 44.5 % (ref 39.0–52.0)
Hemoglobin: 14.8 g/dL (ref 13.0–17.0)
MCH: 32.7 pg (ref 26.0–34.0)
MCHC: 33.3 g/dL (ref 30.0–36.0)
MCV: 98.5 fL (ref 80.0–100.0)
Platelets: 310 10*3/uL (ref 150–400)
RBC: 4.52 MIL/uL (ref 4.22–5.81)
RDW: 11.9 % (ref 11.5–15.5)
WBC: 9.5 10*3/uL (ref 4.0–10.5)
nRBC: 0 % (ref 0.0–0.2)

## 2019-07-27 LAB — CBG MONITORING, ED: Glucose-Capillary: 78 mg/dL (ref 70–99)

## 2019-07-27 LAB — APTT: aPTT: 27 seconds (ref 24–36)

## 2019-07-27 LAB — PROTIME-INR
INR: 1 (ref 0.8–1.2)
Prothrombin Time: 12.6 seconds (ref 11.4–15.2)

## 2019-07-27 MED ORDER — IOHEXOL 350 MG/ML SOLN
100.0000 mL | Freq: Once | INTRAVENOUS | Status: AC | PRN
  Administered 2019-07-27: 100 mL via INTRAVENOUS

## 2019-07-27 MED ORDER — SODIUM CHLORIDE 0.9% FLUSH
3.0000 mL | Freq: Once | INTRAVENOUS | Status: AC
  Administered 2019-07-27: 3 mL via INTRAVENOUS

## 2019-07-27 NOTE — ED Triage Notes (Signed)
Pt reports around 4 am this morning when he woke up he felt dizzy and had some trouble getting his words out. Went to [facility] and they sent him here. No neuro deficits noted in triage. Pt speech clear.

## 2019-07-27 NOTE — ED Provider Notes (Signed)
[facility] EMERGENCY DEPARTMENT Provider Note   CSN: [id number] Arrival date & time: 07/27/19  1143     History Chief Complaint  Patient presents with  . Dizziness  . Aphasia    Gary Lloyd is a 74 y.o. male.  The history is provided by the patient and medical records.   The patient is a 74 year old male with past medical history of non-Hodgkin's lymphoma who presents to the ED for dizziness and difficulty with speech.  Patient reports that when he woke up this morning around 4:30 in the morning he got up from bed and felt dizzy and off balance when walking.  He denies lightheadedness or vertigo, reports that he felt as if he was drunk and unsteady.  He has never had this sensation before.  The patient also reports he had problem with some word finding, no gross aphasia or dysarthria.  He denies any weakness or numbness, changes in vision, ear pain or pressure, fever, chills, nausea, vomiting, diarrhea, chest pain or shortness of breath.  He reports that his imbalance persisted until arriving here in the emergency department, and that he felt normal when he went to bed last night.  His dizziness has resolved at this time but he continues to have some difficulty with finding certain words.     Past Medical History:  Diagnosis Date  . Kidney stones   . Non-Hodgkin's lymphoma ([place]) 2004   remission since 2004    There are no problems to display for this patient.   History reviewed. No pertinent surgical history.     No family history on file.  Social History   Tobacco Use  . Smoking status: Not on file  Substance Use Topics  . Alcohol use: Not on file  . Drug use: Not on file    Home Medications Prior to Admission medications   Medication Sig Start Date End Date Taking? Authorizing Provider  cholecalciferol (VITAMIN D3) 10 MCG (400 UNIT) TABS tablet Take 1,000 Units by mouth daily. 25 MCG    [provider]  HYDROcodone-acetaminophen  (NORCO) 10-325 MG tablet Take 1 tablet by mouth every 6 (six) hours as needed.    [provider]    Allergies    Patient has no allergy information on record.  Review of Systems   Review of Systems  Constitutional: Negative for chills and fever.  Respiratory: Negative for cough and shortness of breath.   Cardiovascular: Negative for chest pain and leg swelling.  Gastrointestinal: Negative for abdominal pain, diarrhea, nausea and vomiting.  Neurological: Positive for dizziness and speech difficulty. Negative for weakness, light-headedness, numbness and headaches.  All other systems reviewed and are negative.   Physical Exam Updated Vital Signs BP (!) 154/74 (BP Location: Right Arm)   Pulse (!) 131   Temp 98.7 F (37.1 C) (Oral)   Resp 14   SpO2 97%   Physical Exam Vitals and nursing note reviewed.  Constitutional:      Appearance: He is well-developed. He is not toxic-appearing or diaphoretic.  HENT:     Head: Normocephalic and atraumatic.     Right Ear: Tympanic membrane normal.     Left Ear: Tympanic membrane normal.     Mouth/Throat:     Mouth: Mucous membranes are moist.     Pharynx: Oropharynx is clear.  Eyes:     Extraocular Movements: Extraocular movements intact.     Conjunctiva/sclera: Conjunctivae normal.     Pupils: Pupils are equal, round, and  reactive to light.  Cardiovascular:     Rate and Rhythm: Normal rate and regular rhythm.     Pulses: Normal pulses.     Heart sounds: No murmur.  Pulmonary:     Effort: Pulmonary effort is normal. No respiratory distress.     Breath sounds: Normal breath sounds.  Abdominal:     Palpations: Abdomen is soft.     Tenderness: There is no abdominal tenderness.  Musculoskeletal:     Cervical back: Neck supple. No rigidity.     Right lower leg: No edema.     Left lower leg: No edema.  Skin:    General: Skin is warm and dry.  Neurological:     Mental Status: He is alert.     GCS: GCS eye subscore is 4. GCS  verbal subscore is 5. GCS motor subscore is 6.     Cranial Nerves: Cranial nerves are intact.     Sensory: Sensation is intact.     Motor: No weakness or pronator drift.     Coordination: Romberg sign negative. Coordination normal. Finger-Nose-Finger Test and Heel to [facility] Test normal.     Gait: Gait normal.     Deep Tendon Reflexes: Reflexes are normal and symmetric. Babinski sign absent on the right side. Babinski sign absent on the left side.     ED Results / Procedures / Treatments   Labs (all labs ordered are listed, but only abnormal results are displayed) Labs Reviewed  COMPREHENSIVE METABOLIC PANEL - Abnormal; Notable for the following components:      Result Value   Glucose, Bld 109 (*)    All other components within normal limits  I-STAT CHEM 8, ED - Abnormal; Notable for the following components:   BUN 24 (*)    Glucose, Bld 108 (*)    All other components within normal limits  PROTIME-INR  APTT  CBC  DIFFERENTIAL  CBG MONITORING, ED    EKG EKG Interpretation  Date/Time:  Thursday July 27 2019 11:45:53 EST Ventricular Rate:  66 PR Interval:  136 QRS Duration: 120 QT Interval:  394 QTC Calculation: 413 R Axis:   -53 Text Interpretation: Normal sinus rhythm Left axis deviation Non-specific intra-ventricular conduction delay Abnormal ECG No prior ECG for comparison. NO sTEMI Confirmed by Antony Blackbird [phone]) on 07/27/2019 4:06:18 PM   Radiology CT Angio Head W or Wo Contrast  Result Date: 07/27/2019 CLINICAL DATA:  Acute presentation with dizziness and speech disturbance. EXAM: CT ANGIOGRAPHY HEAD AND NECK TECHNIQUE: Multidetector CT imaging of the head and neck was performed using the standard protocol during bolus administration of intravenous contrast. Multiplanar CT image reconstructions and MIPs were obtained to evaluate the vascular anatomy. Carotid stenosis measurements (when applicable) are obtained utilizing NASCET criteria, using the distal internal  carotid diameter as the denominator. CONTRAST:  179mL OMNIPAQUE IOHEXOL 350 MG/ML SOLN COMPARISON:  None. FINDINGS: CT HEAD FINDINGS Brain: The brain shows a normal appearance without evidence of malformation, atrophy, old or acute small or large vessel infarction, mass lesion, hemorrhage, hydrocephalus or extra-axial collection. Vascular: No hyperdense vessel. No evidence of atherosclerotic calcification. Skull: Normal.  No traumatic finding.  No focal bone lesion. Sinuses/Orbits: Subtotal opacification of the left division of the sphenoid sinus. Other sinuses are clear. Orbits negative. Other: None significant CTA NECK FINDINGS Aortic arch: Mild aortic atherosclerotic calcification. Branching pattern is normal without origin stenosis. Right carotid system: Common carotid artery widely patent to the bifurcation. Carotid bifurcation widely patent without soft or calcified  plaque. Cervical internal carotid arteries are tortuous, swinging to the midline behind the oropharynx. Left carotid system: Common carotid artery widely patent to the bifurcation. Carotid bifurcation widely patent. Mild atherosclerotic plaque in the distal ICA bulb but no stenosis. Vertebral arteries: Both vertebral arteries are widely patent at their origin and through the cervical region to the foramen magnum. Skeleton: Ordinary mild spondylosis. Other neck: No mass or lymphadenopathy. Upper chest: Normal Review of the MIP images confirms the above findings CTA HEAD FINDINGS Anterior circulation: Both internal carotid arteries widely patent through the skull base and siphon regions. No siphon stenosis. The anterior and middle cerebral vessels are patent without proximal stenosis, aneurysm or vascular malformation. No large or medium vessel occlusion. Both posterior cerebral arteries receive most of there supply from the anterior circulation. Posterior circulation: Both vertebral arteries are patent through the foramen magnum to the basilar. No  basilar stenosis. Posterior circulation branch vessels appear normal. Fetal origin PCAs as noted above. Venous sinuses: Patent and normal. Anatomic variants: None significant. Review of the MIP images confirms the above findings IMPRESSION: No acute finding.  No large or medium vessel occlusion. Mild aortic atherosclerosis. Minimal atherosclerosis at the left carotid bifurcation but no stenosis. Internal carotid arteries are tortuous, swinging behind the oropharynx. No posterior circulation pathology seen to explain dizziness. Electronically Signed   By: Nelson Chimes M.D.   On: 07/27/2019 19:25   CT Angio Neck W and/or Wo Contrast  Result Date: 07/27/2019 CLINICAL DATA:  Acute presentation with dizziness and speech disturbance. EXAM: CT ANGIOGRAPHY HEAD AND NECK TECHNIQUE: Multidetector CT imaging of the head and neck was performed using the standard protocol during bolus administration of intravenous contrast. Multiplanar CT image reconstructions and MIPs were obtained to evaluate the vascular anatomy. Carotid stenosis measurements (when applicable) are obtained utilizing NASCET criteria, using the distal internal carotid diameter as the denominator. CONTRAST:  171mL OMNIPAQUE IOHEXOL 350 MG/ML SOLN COMPARISON:  None. FINDINGS: CT HEAD FINDINGS Brain: The brain shows a normal appearance without evidence of malformation, atrophy, old or acute small or large vessel infarction, mass lesion, hemorrhage, hydrocephalus or extra-axial collection. Vascular: No hyperdense vessel. No evidence of atherosclerotic calcification. Skull: Normal.  No traumatic finding.  No focal bone lesion. Sinuses/Orbits: Subtotal opacification of the left division of the sphenoid sinus. Other sinuses are clear. Orbits negative. Other: None significant CTA NECK FINDINGS Aortic arch: Mild aortic atherosclerotic calcification. Branching pattern is normal without origin stenosis. Right carotid system: Common carotid artery widely patent to the  bifurcation. Carotid bifurcation widely patent without soft or calcified plaque. Cervical internal carotid arteries are tortuous, swinging to the midline behind the oropharynx. Left carotid system: Common carotid artery widely patent to the bifurcation. Carotid bifurcation widely patent. Mild atherosclerotic plaque in the distal ICA bulb but no stenosis. Vertebral arteries: Both vertebral arteries are widely patent at their origin and through the cervical region to the foramen magnum. Skeleton: Ordinary mild spondylosis. Other neck: No mass or lymphadenopathy. Upper chest: Normal Review of the MIP images confirms the above findings CTA HEAD FINDINGS Anterior circulation: Both internal carotid arteries widely patent through the skull base and siphon regions. No siphon stenosis. The anterior and middle cerebral vessels are patent without proximal stenosis, aneurysm or vascular malformation. No large or medium vessel occlusion. Both posterior cerebral arteries receive most of there supply from the anterior circulation. Posterior circulation: Both vertebral arteries are patent through the foramen magnum to the basilar. No basilar stenosis. Posterior circulation branch vessels appear normal.  Fetal origin PCAs as noted above. Venous sinuses: Patent and normal. Anatomic variants: None significant. Review of the MIP images confirms the above findings IMPRESSION: No acute finding.  No large or medium vessel occlusion. Mild aortic atherosclerosis. Minimal atherosclerosis at the left carotid bifurcation but no stenosis. Internal carotid arteries are tortuous, swinging behind the oropharynx. No posterior circulation pathology seen to explain dizziness. Electronically Signed   By: Nelson Chimes M.D.   On: 07/27/2019 19:25   MR BRAIN WO CONTRAST  Result Date: 07/27/2019 CLINICAL DATA:  Ataxia, stroke suspected. Dizziness. EXAM: MRI HEAD WITHOUT CONTRAST TECHNIQUE: Multiplanar, multiecho pulse sequences of the brain and  surrounding structures were obtained without intravenous contrast. COMPARISON:  Head CT/CTA 07/27/2019 FINDINGS: Brain: There is no evidence of acute infarct, intracranial hemorrhage, mass, midline shift, or extra-axial fluid collection. The ventricles and sulci are within normal limits for age. Punctate foci of T2 hyperintensity scattered throughout the cerebral white matter bilaterally are nonspecific but compatible with mild chronic small vessel ischemic disease. No focal cerebellar insult is identified. Vascular: Major intracranial vascular flow voids are preserved. Skull and upper cervical spine: Unremarkable bone marrow signal. Sinuses/Orbits: Unremarkable orbits. Chronic left sphenoid sinusitis. Clear mastoid air cells. Other: None. IMPRESSION: 1. No acute intracranial abnormality. 2. Mild chronic small vessel ischemic disease. Electronically Signed   By: Logan Bores M.D.   On: 07/27/2019 21:05    Procedures Procedures (including critical care time)  Medications Ordered in ED Medications  sodium chloride flush (NS) 0.9 % injection 3 mL (3 mLs Intravenous Given 07/27/19 1926)  iohexol (OMNIPAQUE) 350 MG/ML injection 100 mL (100 mLs Intravenous Contrast Given 07/27/19 1904)    ED Course  I have reviewed the triage vital signs and the nursing notes.  Pertinent labs & imaging results that were available during my care of the patient were reviewed by me and considered in my medical decision making (see chart for details).    MDM Rules/Calculators/A&P                      Here for difficulty with ambulation and word finding problems.  CTA head and neck ordered to rule out posterior circulation CVA and was negative.  MRI then ordered to rule out CVA and negative for acute stroke or mass.    Rechecked the pt, symptoms have resolved, able to ambulate without difficulty.    Strict return precaution provided, will follow up with PCP, discharged home in stable condition.     Final Clinical  Impression(s) / ED Diagnoses Final diagnoses:  Dizziness    Rx / DC Orders ED Discharge Orders    None       Aubrii Sharpless, [place], MD 07/27/19 2253    Tegeler, Gwenyth Allegra, MD 07/28/19 [phone]

## 2019-07-27 NOTE — ED Notes (Signed)
CBG 78. 

## 2019-07-27 NOTE — ED Notes (Signed)
Patient transported to MRI 

## 2020-07-03 IMAGING — CT CT ANGIO HEAD
1 of 11 series · 5 of 33 positions shown · IV contrast (APPLIED)
Comparison: None.

CLINICAL DATA: Acute presentation with dizziness and speech
disturbance.

EXAM:
CT ANGIOGRAPHY HEAD AND NECK
TECHNIQUE: Multidetector CT imaging of the head and neck was performed using
the standard protocol during bolus administration of intravenous
contrast. Multiplanar CT image reconstructions and MIPs were
obtained to evaluate the vascular anatomy. Carotid stenosis
measurements (when applicable) are obtained utilizing NASCET
criteria, using the distal internal carotid diameter as the
denominator.
CONTRAST:  100mL OMNIPAQUE IOHEXOL 350 MG/ML SOLN

[Series 11: ax thins · axial · 0.46mm/px · z∈[+1178,+1425]mm · 5 of 371 slices shown]
[im 62/371  soft-tissue]
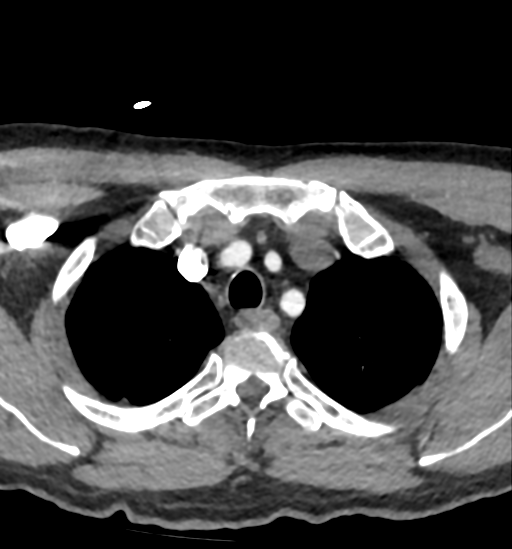
[im 124/371  bone]
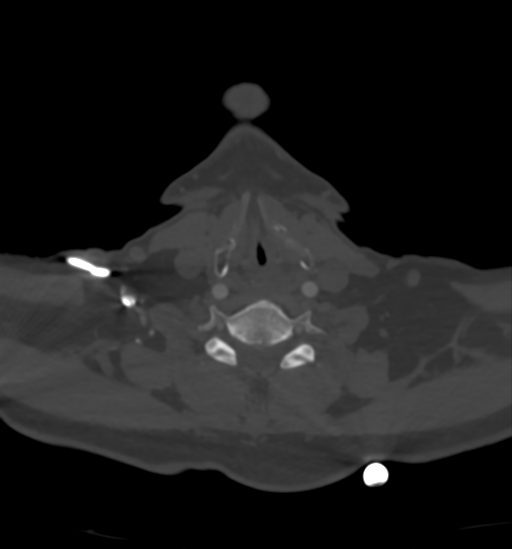
[im 186/371  soft-tissue]
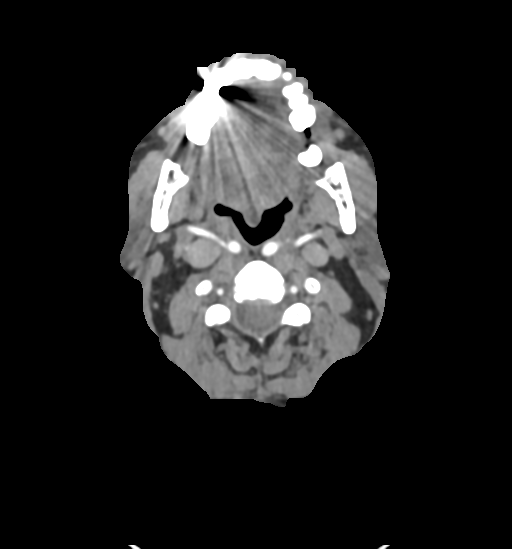
[im 247/371  bone]
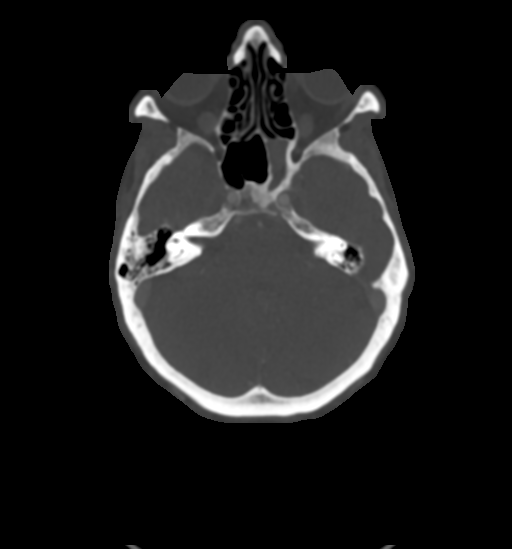
[im 309/371  soft-tissue]
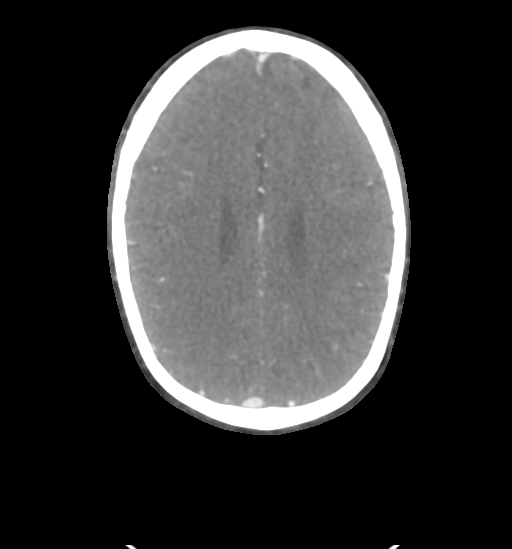

[5 of 33 positions shown; findings below may reference images not displayed]

FINDINGS: CT HEAD FINDINGS

Brain: The brain shows a normal appearance without evidence of
malformation, atrophy, old or acute small or large vessel
infarction, mass lesion, hemorrhage, hydrocephalus or extra-axial
collection.

Vascular: No hyperdense vessel. No evidence of atherosclerotic
calcification.

Skull: Normal.  No traumatic finding.  No focal bone lesion.

Sinuses/Orbits: Subtotal opacification of the left division of the
sphenoid sinus. Other sinuses are clear. Orbits negative.

Other: None significant

CTA NECK FINDINGS

Aortic arch: Mild aortic atherosclerotic calcification. Branching
pattern is normal without origin stenosis.

Right carotid system: Common carotid artery widely patent to the
bifurcation. Carotid bifurcation widely patent without soft or
calcified plaque. Cervical internal carotid arteries are tortuous,
swinging to the midline behind the oropharynx.

Left carotid system: Common carotid artery widely patent to the
bifurcation. Carotid bifurcation widely patent. Mild atherosclerotic
plaque in the distal ICA bulb but no stenosis.

Vertebral arteries: Both vertebral arteries are widely patent at
their origin and through the cervical region to the foramen magnum.

Skeleton: Ordinary mild spondylosis.

Other neck: No mass or lymphadenopathy.

Upper chest: Normal

Review of the MIP images confirms the above findings

CTA HEAD FINDINGS

Anterior circulation: Both internal carotid arteries widely patent
through the skull base and siphon regions. No siphon stenosis. The
anterior and middle cerebral vessels are patent without proximal
stenosis, aneurysm or vascular malformation. No large or medium
vessel occlusion. Both posterior cerebral arteries receive most of
there supply from the anterior circulation.

Posterior circulation: Both vertebral arteries are patent through
the foramen magnum to the basilar. No basilar stenosis. Posterior
circulation branch vessels appear normal. Fetal origin PCAs as noted
above.

Venous sinuses: Patent and normal.

Anatomic variants: None significant.

Review of the MIP images confirms the above findings
IMPRESSION: No acute finding.  No large or medium vessel occlusion.

Mild aortic atherosclerosis. Minimal atherosclerosis at the left
carotid bifurcation but no stenosis. Internal carotid arteries are
tortuous, swinging behind the oropharynx.

No posterior circulation pathology seen to explain dizziness.
